# Patient Record
Sex: Male | Born: 1980 | Hispanic: Yes | Marital: Married | Smoking: Former smoker
Health system: Southern US, Community
[De-identification: ages and names within clinical notes are randomized; demographics above are authoritative.]

## PROBLEM LIST (undated history)

## (undated) DIAGNOSIS — N481 Balanitis: Secondary | ICD-10-CM

## (undated) DIAGNOSIS — N471 Phimosis: Secondary | ICD-10-CM

---

## 2009-11-14 HISTORY — PX: VASECTOMY: SHX75

## 2015-03-02 ENCOUNTER — Other Ambulatory Visit: Payer: Self-pay | Admitting: Urology

## 2015-03-04 ENCOUNTER — Encounter (HOSPITAL_BASED_OUTPATIENT_CLINIC_OR_DEPARTMENT_OTHER): Payer: Self-pay | Admitting: *Deleted

## 2015-03-06 ENCOUNTER — Ambulatory Visit (HOSPITAL_BASED_OUTPATIENT_CLINIC_OR_DEPARTMENT_OTHER): Admission: RE | Admit: 2015-03-06 | Payer: PRIVATE HEALTH INSURANCE | Source: Ambulatory Visit | Admitting: Urology

## 2015-03-06 ENCOUNTER — Encounter (HOSPITAL_BASED_OUTPATIENT_CLINIC_OR_DEPARTMENT_OTHER): Admission: RE | Payer: Self-pay | Source: Ambulatory Visit

## 2015-03-06 HISTORY — DX: Phimosis: N47.1

## 2015-03-06 HISTORY — DX: Balanitis: N48.1

## 2015-03-06 SURGERY — CIRCUMCISION, ADULT
Anesthesia: General

## 2017-06-25 ENCOUNTER — Emergency Department: Payer: PRIVATE HEALTH INSURANCE

## 2017-06-25 ENCOUNTER — Emergency Department
Admission: EM | Admit: 2017-06-25 | Discharge: 2017-06-25 | Disposition: A | Discharge status: Home / Self Care | Payer: PRIVATE HEALTH INSURANCE | Attending: Emergency Medicine | Admitting: Emergency Medicine

## 2017-06-25 ENCOUNTER — Encounter: Payer: Self-pay | Admitting: Emergency Medicine

## 2017-06-25 DIAGNOSIS — R079 Chest pain, unspecified: Secondary | ICD-10-CM | POA: Diagnosis present

## 2017-06-25 DIAGNOSIS — Z87891 Personal history of nicotine dependence: Secondary | ICD-10-CM | POA: Diagnosis not present

## 2017-06-25 DIAGNOSIS — R091 Pleurisy: Secondary | ICD-10-CM | POA: Diagnosis not present

## 2017-06-25 LAB — BASIC METABOLIC PANEL
ANION GAP: 7 (ref 5–15)
BUN: 11 mg/dL (ref 6–20)
CALCIUM: 9.3 mg/dL (ref 8.9–10.3)
CO2: 23 mmol/L (ref 22–32)
Chloride: 108 mmol/L (ref 101–111)
Creatinine, Ser: 0.87 mg/dL (ref 0.61–1.24)
GFR calc Af Amer: >60 mL/min (ref >60)
GLUCOSE: 92 mg/dL (ref 65–99)
Potassium: 4 mmol/L (ref 3.5–5.1)
SODIUM: 138 mmol/L (ref 135–145)

## 2017-06-25 LAB — CBC
HCT: 44.4 % (ref 40.0–52.0)
Hemoglobin: 15.4 g/dL (ref 13.0–18.0)
MCH: 29.5 pg (ref 26.0–34.0)
MCHC: 34.8 g/dL (ref 32.0–36.0)
MCV: 84.8 fL (ref 80.0–100.0)
PLATELETS: 252 10*3/uL (ref 150–440)
RBC: 5.23 MIL/uL (ref 4.40–5.90)
RDW: 12.7 % (ref 11.5–14.5)
WBC: 6.6 10*3/uL (ref 3.8–10.6)

## 2017-06-25 LAB — TROPONIN I

## 2017-06-25 MED ORDER — IBUPROFEN 800 MG PO TABS
800.0000 mg | ORAL_TABLET | ORAL | Status: AC
  Administered 2017-06-25: 800 mg via ORAL
  Filled 2017-06-25: qty 1

## 2017-06-25 MED ORDER — ASPIRIN 81 MG PO CHEW
324.0000 mg | CHEWABLE_TABLET | Freq: Once | ORAL | Status: AC
  Administered 2017-06-25: 324 mg via ORAL
  Filled 2017-06-25: qty 4

## 2017-06-25 NOTE — ED Triage Notes (Signed)
FIRST NURSE NOTE-pt c/o chest pain. Has [employer] in hand. Instructed not to eat until seen by MD. Ambulatory without difficulty

## 2017-06-25 NOTE — ED Triage Notes (Signed)
Pt to ED c/o Chest pain and shortness of breath. Pt states that the pain started 4 days ago. Pain is worse when he is laying down. Pt denies radiation of pain, N,V. Pt states that pain is constant and worse when taking a deep breath. Pt is able to talk in complete sentences, does not appear to be in any distress at this time.

## 2017-06-25 NOTE — Discharge Instructions (Signed)
You have been seen in the Emergency Department (ED) today for chest pain.    Return to the Emergency Department (ED) if you experience any concerning chest pain/pressure/tightness, difficulty breathing, or sudden sweating, or other symptoms that concern you.

## 2017-06-25 NOTE — ED Provider Notes (Signed)
[facility] Emergency Department Provider Note   ____________________________________________   First MD Initiated Contact with Patient 06/25/17 1203     (approximate)  I have reviewed the triage vital signs and the nursing notes.   HISTORY  Chief Complaint Chest Pain    HPI Scott Hart is a 36 y.o. male experiencing a feeling of discomfort in his mid chest for about 3 days now. Reports that he had a slight cough with a small amount of congestion that started about 4 days ago and has not gotten better, but is been experiencing and discomfort in his middle chest feels slightly sharp when he takes a deep breath. Seems slightly worse when he lays down. No nausea or vomiting. No pain or radiation of the arm neck back or jaw.  Denies feeling short of breath. Has no history of heart disease. Has lost weight recently with intention.  No history of surgeries recently. No trauma. No long trips or travel. No history of blood clots. Does not smoke or drink or use   Describes a mild, sharp and intermittent discomfort in the chest that seems to be worse whenever he takes a deep breath and goes away when he sits still.  Past Medical History:  Diagnosis Date  . Balanitis   . Phimosis     There are no active problems to display for this patient.   History reviewed. No pertinent surgical history.  Prior to Admission medications   Not on File    Allergies Patient has no known allergies.  No family history on file.  Social History Social History  Substance Use Topics  . Smoking status: Former Games developer  . Smokeless tobacco: Never Used  . Alcohol use No    Review of Systems Constitutional: No fever/chills Eyes: No visual changes. ENT: No sore throat. Cardiovascular: see history of present illness Respiratory: Denies shortness of breath.slight cough with a minimally productive component a few days ago that is now improving. Gastrointestinal: No  abdominal pain.  No nausea, no vomiting.  No diarrhea.  No constipation. Genitourinary: Negative for dysuria. Musculoskeletal: Negative for back pain. Skin: Negative for rash. Neurological: Negative for headaches, focal weakness or numbness.    ____________________________________________   PHYSICAL EXAM:  VITAL SIGNS: ED Triage Vitals  Enc Vitals Group     BP 06/25/17 1125 116/77     Pulse Rate 06/25/17 1125 70     Resp 06/25/17 1125 18     Temp 06/25/17 1125 99.2 F (37.3 C)     Temp Source 06/25/17 1125 Oral     SpO2 06/25/17 1125 100 %     Weight 06/25/17 1125 248 lb (112.5 kg)     Height 06/25/17 1125 6\' 1"  (1.854 m)     Head Circumference --      Peak Flow --      Pain Score 06/25/17 1128 3     Pain Loc --      Pain Edu? --      Excl. in GC? --     Constitutional: Alert and oriented. Well appearing and in no acute distress. Eyes: Conjunctivae are normal. Head: Atraumatic. Nose: No congestion/rhinnorhea. Mouth/Throat: Mucous membranes are moist. Neck: No stridor.   Cardiovascular: Normal rate, regular rhythm. Grossly normal heart sounds.  Good peripheral circulation. When laying flat, taking deep breath he reports pain is more prominent. No pain when breathing normally. Respiratory: Normal respiratory effort.  No retractions. Lungs CTAB. Gastrointestinal: Soft and nontender. No distention. Musculoskeletal: No  lower extremity tenderness nor edema. Neurologic:  Normal speech and language. No gross focal neurologic deficits are appreciated.  Skin:  Skin is warm, dry and intact. No rash noted. Psychiatric: Mood and affect are normal. Speech and behavior are normal.  ____________________________________________   LABS (all labs ordered are listed, but only abnormal results are displayed)  Labs Reviewed  BASIC METABOLIC PANEL  CBC  TROPONIN I   ____________________________________________  EKG  ED ECG REPORT I, Geneva Barrero, the attending physician,  personally viewed and interpreted this ECG.  Date: 06/25/2017 EKG Time: 1120 Rate: 70 Rhythm: normal sinus rhythm QRS Axis: normal Intervals: normal ST/T Wave abnormalities: normal Narrative Interpretation: unremarkable  ____________________________________________  RADIOLOGY  Dg Chest 2 View  Result Date: 06/25/2017 CLINICAL DATA:  Chest pain shortness of breath for the past 4 days. Ex-smoker. EXAM: CHEST  2 VIEW COMPARISON:  None. FINDINGS: Normal sized heart. Clear lungs. Mild peribronchial thickening. Minimal thoracic spine degenerative changes. IMPRESSION: Mild bronchitic changes. Electronically Signed   By: Beckie SaltsSteven  Reid M.D.   On: 06/25/2017 11:58    ____________________________________________   PROCEDURES  Procedure(s) performed: None  Procedures  Critical Care performed: No  ____________________________________________   INITIAL IMPRESSION / ASSESSMENT AND PLAN / ED COURSE  Pertinent labs & imaging results that were available during my care of the patient were reviewed by me and considered in my medical decision making (see chart for details).  Pleuritic symptoms.Follow a syndrome of what sounds similar to bronchitis or upper respiratory infection with a slight cough that is now improved. EKG and troponin normal. No risk factor for pulmonary embolism, dissection, pneumothorax. No associated abdominal pain. Heat score low risk.     Pulmonary Embolism Rule-out Criteria (PERC rule)                        If YES to ANY of the following, the PERC rule is not satisfied and cannot be used to rule out PE in this patient (consider d-dimer or imaging depending on pre-test probability).                      If NO to ALL of the following, AND the clinician's pre-test probability is <15%, the [facility] rule is satisfied and there is no need for further workup (including no need to obtain a d-dimer) as the post-test probability of pulmonary embolism is <2%.                       Mnemonic is HAD CLOTS   H - hormone use (exogenous estrogen)      No. A - age > 50                                                 No. D - DVT/PE history                                      No.   C - coughing blood (hemoptysis)                 No. L - leg swelling, unilateral  No. O - O2 Sat on Room Air < 95%                  No. T - tachycardia (HR ? 100)                         No. S - surgery or trauma, recent                      No.   Based on my evaluation of the patient, including application of this decision instrument, further testing to evaluate for pulmonary embolism is not indicated at this time. I have discussed this recommendation with the patient who states understanding and agreement with this plan.     ----------------------------------------- 12:37 PM on 06/25/2017 -----------------------------------------  Return precautions and treatment recommendations and follow-up discussed with the patient who is agreeable with the plan.   ____________________________________________   FINAL CLINICAL IMPRESSION(S) / ED DIAGNOSES  Final diagnoses:  Pleurisy  viral syndrome, now resolving    NEW MEDICATIONS STARTED DURING THIS VISIT:  There are no discharge medications for this patient.    Note:  This document was prepared using Dragon voice recognition software and may include unintentional dictation errors.     Sharyn Creamer, MD 06/25/17 [phone]

## 2017-12-26 ENCOUNTER — Encounter: Payer: Managed Care, Other (non HMO) | Admitting: Podiatry

## 2018-01-04 ENCOUNTER — Ambulatory Visit: Payer: Managed Care, Other (non HMO) | Admitting: Podiatry

## 2018-01-04 ENCOUNTER — Ambulatory Visit (INDEPENDENT_AMBULATORY_CARE_PROVIDER_SITE_OTHER): Payer: Managed Care, Other (non HMO)

## 2018-01-04 ENCOUNTER — Encounter: Payer: Self-pay | Admitting: Podiatry

## 2018-01-04 VITALS — BP 106/71 | HR 72 | Resp 16

## 2018-01-04 DIAGNOSIS — M7750 Other enthesopathy of unspecified foot: Secondary | ICD-10-CM | POA: Diagnosis not present

## 2018-01-04 DIAGNOSIS — S93402A Sprain of unspecified ligament of left ankle, initial encounter: Secondary | ICD-10-CM | POA: Diagnosis not present

## 2018-01-04 NOTE — Progress Notes (Signed)
  Subjective:  Patient ID: Scott Hart, male    DOB: 24-Nov-1980,  MRN: [id number] HPI Chief Complaint  Patient presents with  . Ankle Pain    Anterior and lateral ankle left - injury (fell) in July 2018, initial bruising and swelling, went to UC - gave brace to support, better, but still having discomfort depending on activity levels    37 y.o. male presents with the above complaint.     Past Medical History:  Diagnosis Date  . Balanitis   . Phimosis    No past surgical history on file. No current outpatient medications on file.  No Known Allergies Review of Systems  All other systems reviewed and are negative.  Objective:   Vitals:   01/04/18 1526  BP: 106/71  Pulse: 72  Resp: 16    General: Well developed, nourished, in no acute distress, alert and oriented x3   Dermatological: Skin is warm, dry and supple bilateral. Nails x 10 are well maintained; remaining integument appears unremarkable at this time. There are no open sores, no preulcerative lesions, no rash or signs of infection present.  Vascular: Dorsalis Pedis artery and Posterior Tibial artery pedal pulses are 2/4 bilateral with immedate capillary fill time. Pedal hair growth present. No varicosities and no lower extremity edema present bilateral.   Neruologic: Grossly intact via light touch bilateral. Vibratory intact via tuning fork bilateral. Protective threshold with Semmes Wienstein monofilament intact to all pedal sites bilateral. Patellar and Achilles deep tendon reflexes 2+ bilateral. No Babinski or clonus noted bilateral.   Musculoskeletal: No gross boney pedal deformities bilateral. No pain, crepitus, or limitation noted with foot and ankle range of motion bilateral. Muscular strength 5/5 in all groups tested bilateral.  Mild tenderness on palpation of the anterior talofibular ligament it feels thick in this area but there is no anterior drawer.  There is no crepitation on range of motion of the ankle  joint.  And does not appear to be unstable on inversion or eversion.  There is minimal reproducible pain.  Gait: Unassisted, Nonantalgic.    Radiographs:  Radiographs taken do not demonstrate any type of acute findings.  Soft tissue swelling to the anterior ankle only.  Assessment & Plan:   Assessment: Probable tear of the anterior talofibular ligament.  Ankle sprain.  Plan: Discussed etiology pathology conservative versus surgical therapies.  Recommended that he continue compression therapy and I dispensed ankle compression devices.  If the foot still feels unstable after 6 months from injury I recommend an MRI otherwise I will follow-up with him on an as-needed basis.     Max T. ScribnerHyatt, North DakotaDPM

## 2018-01-21 IMAGING — CR DG CHEST 2V
2 series · 2 of 2 positions shown · non-contrast
Comparison: None.

CLINICAL DATA: Chest pain shortness of breath for the past 4 days.
Ex-smoker.

EXAM:
CHEST  2 VIEW

[chest pa]
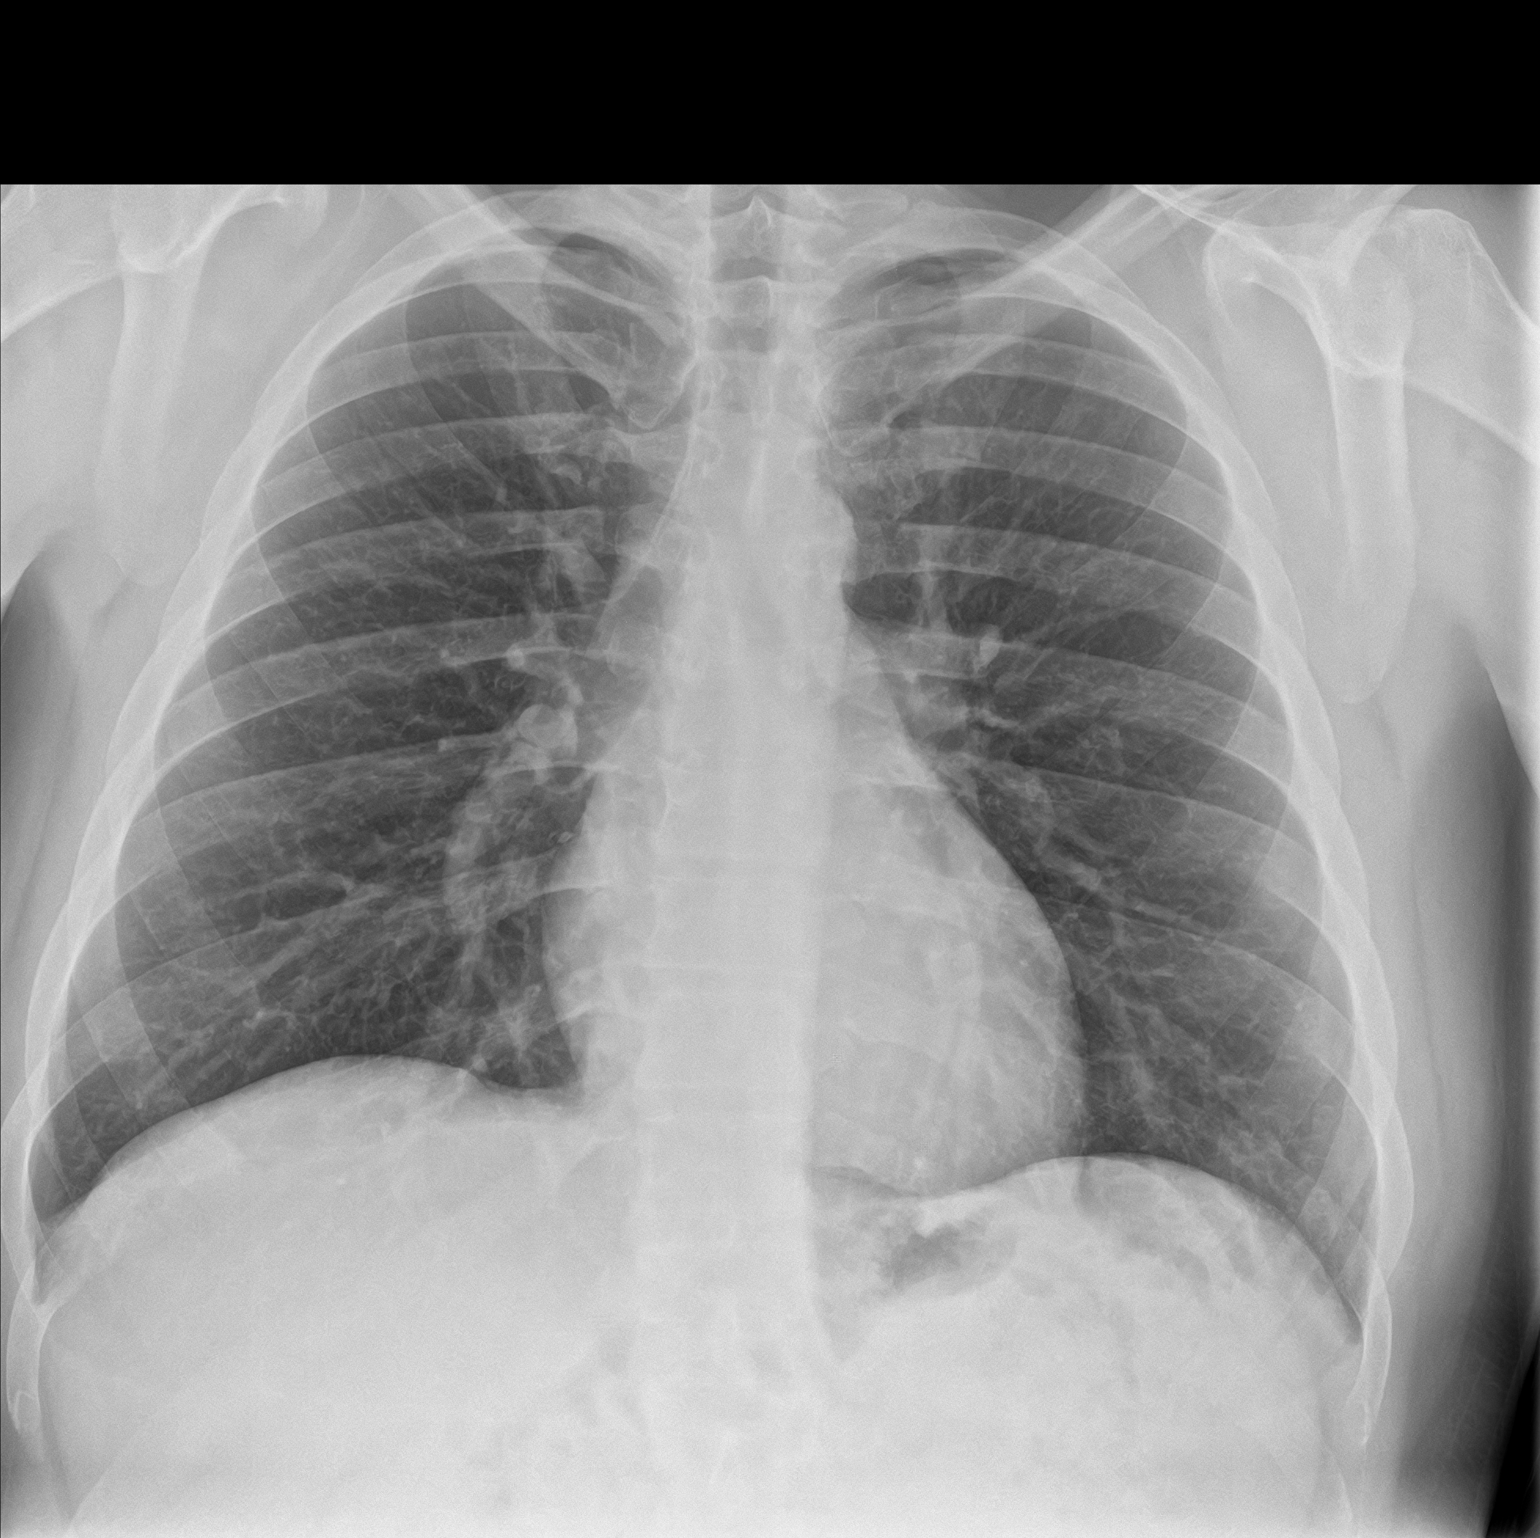

[chest lat]
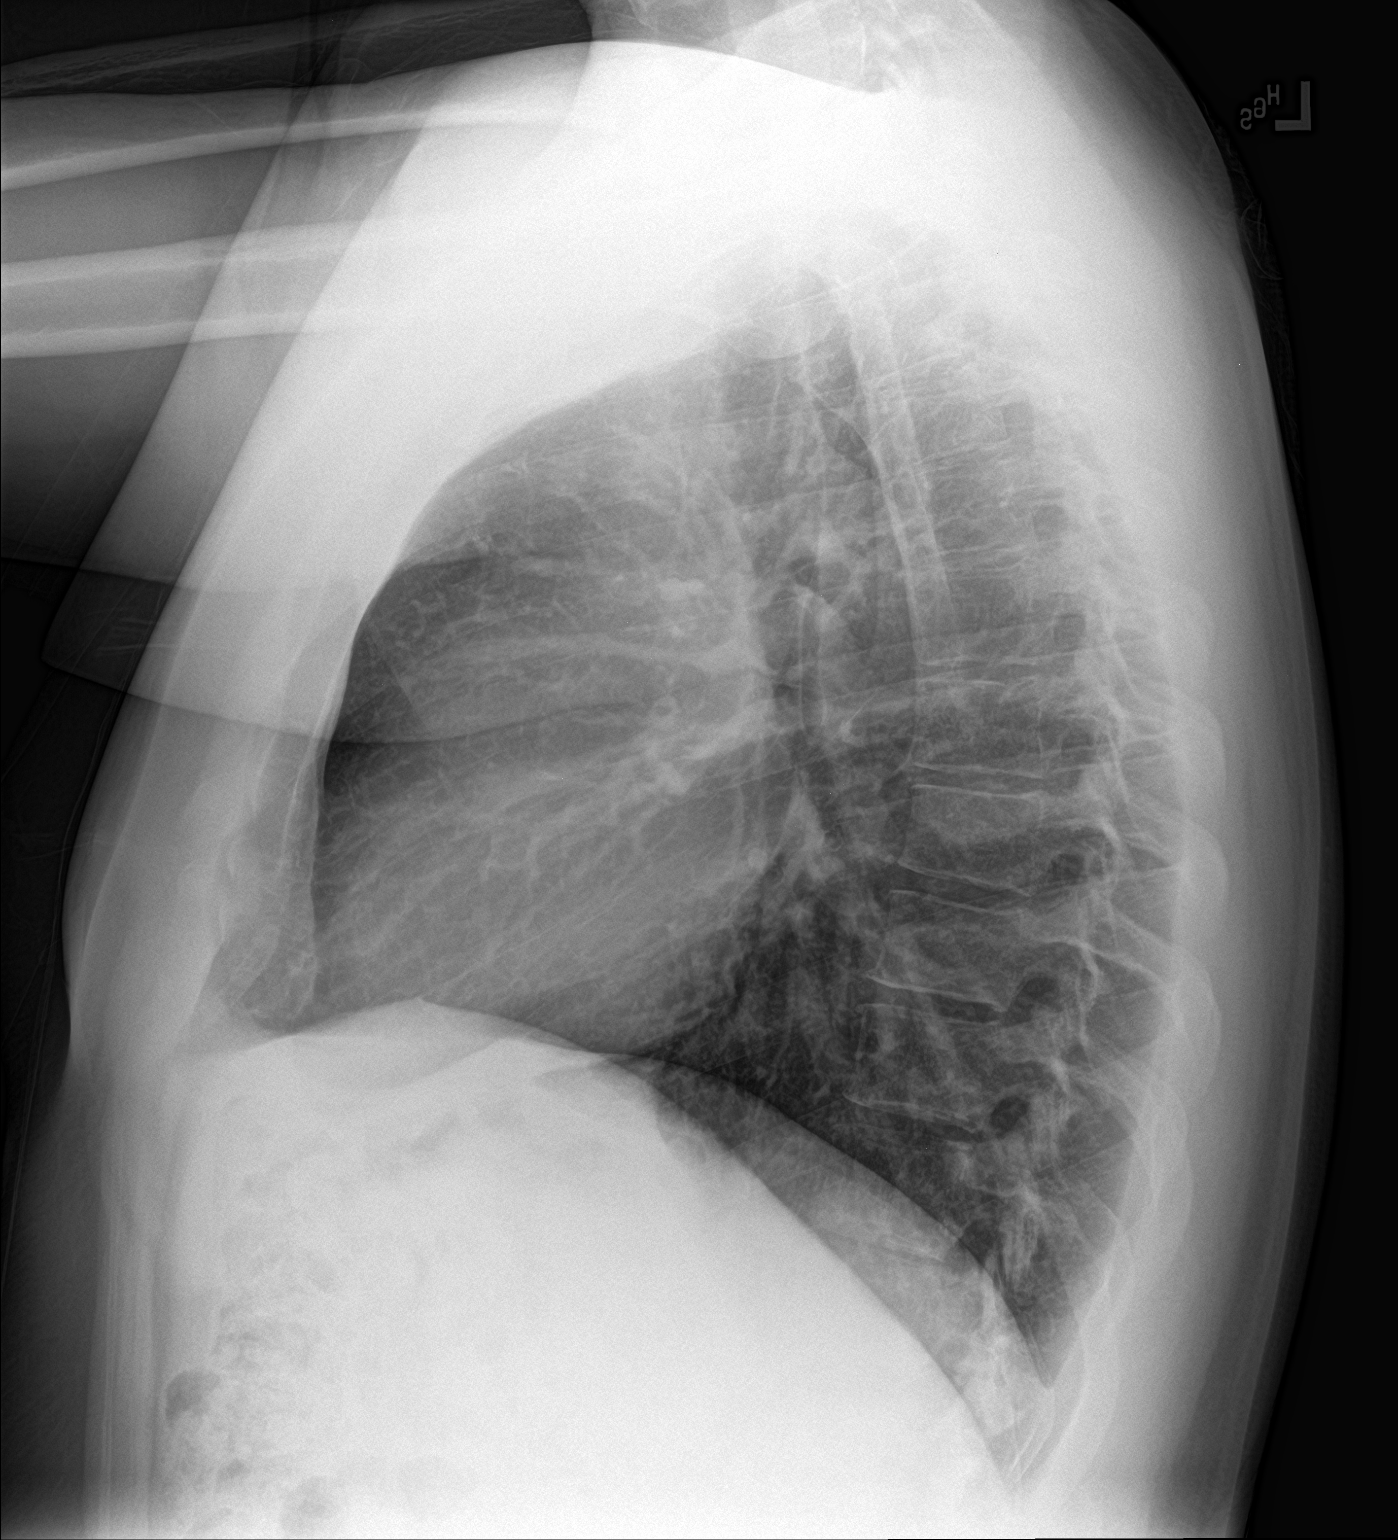

[2 of 2 positions shown; findings below may reference images not displayed]

FINDINGS: Normal sized heart. Clear lungs. Mild peribronchial thickening.
Minimal thoracic spine degenerative changes.
IMPRESSION: Mild bronchitic changes.

## 2018-01-22 NOTE — Progress Notes (Signed)
This encounter was created in error - please disregard.

## 2019-09-19 ENCOUNTER — Ambulatory Visit: Payer: Managed Care, Other (non HMO)

## 2019-09-19 ENCOUNTER — Encounter: Payer: Managed Care, Other (non HMO) | Admitting: Podiatry

## 2019-09-19 NOTE — Progress Notes (Signed)
This encounter was created in error - please disregard.

## 2020-02-29 ENCOUNTER — Encounter (HOSPITAL_COMMUNITY): Payer: Self-pay

## 2020-02-29 ENCOUNTER — Other Ambulatory Visit: Payer: Self-pay

## 2020-02-29 ENCOUNTER — Ambulatory Visit (HOSPITAL_COMMUNITY)
Admission: EM | Admit: 2020-02-29 | Discharge: 2020-02-29 | Disposition: A | Discharge status: Home / Self Care | Payer: Managed Care, Other (non HMO) | Attending: Emergency Medicine | Admitting: Emergency Medicine

## 2020-02-29 DIAGNOSIS — R05 Cough: Secondary | ICD-10-CM | POA: Insufficient documentation

## 2020-02-29 DIAGNOSIS — B349 Viral infection, unspecified: Secondary | ICD-10-CM | POA: Diagnosis not present

## 2020-02-29 DIAGNOSIS — Z87891 Personal history of nicotine dependence: Secondary | ICD-10-CM | POA: Diagnosis not present

## 2020-02-29 DIAGNOSIS — R439 Unspecified disturbances of smell and taste: Secondary | ICD-10-CM | POA: Insufficient documentation

## 2020-02-29 DIAGNOSIS — R42 Dizziness and giddiness: Secondary | ICD-10-CM | POA: Insufficient documentation

## 2020-02-29 DIAGNOSIS — U071 COVID-19: Secondary | ICD-10-CM

## 2020-02-29 DIAGNOSIS — R079 Chest pain, unspecified: Secondary | ICD-10-CM | POA: Diagnosis not present

## 2020-02-29 DIAGNOSIS — R0602 Shortness of breath: Secondary | ICD-10-CM | POA: Diagnosis not present

## 2020-02-29 HISTORY — DX: COVID-19: U07.1

## 2020-02-29 NOTE — ED Provider Notes (Signed)
[facility]    CSN: [id number] Arrival date & time: 02/29/20  1033      History   Chief Complaint Chief Complaint  Patient presents with  . Chest Pain  . Shortness of Breath    HPI Scott Hart is a 39 y.o. male.   Patient is a 39 year old male with no significant past medical history.  He presents today with approximate 3 days of intermittent, chest pain, mild shortness of breath and tightness in chest, nasal congestion, headache.  Worse with sitting upright and standing.  No specific shortness of breath on movement or exertion.  He has had abnormal sensation to left arm for the past 3 days.  Mild lightheadedness yesterday.  Recent travel to [place].  Has had some mild fever and chills.  Has been using over-the-counter cough drops for dry cough.  Has also had some loss of taste and smell.  No history of DVT or PE  ROS per HPI      Past Medical History:  Diagnosis Date  . Balanitis   . Phimosis     There are no problems to display for this patient.   History reviewed. No pertinent surgical history.     Home Medications    Prior to Admission medications   Not on File    Family History Family History  Problem Relation Age of Onset  . Healthy Mother   . Healthy Father     Social History Social History   Tobacco Use  . Smoking status: Former Games developer  . Smokeless tobacco: Never Used  Substance Use Topics  . Alcohol use: No  . Drug use: Never     Allergies   Patient has no known allergies.   Review of Systems Review of Systems   Physical Exam Triage Vital Signs ED Triage Vitals  Enc Vitals Group     BP 02/29/20 1128 135/90     Pulse Rate 02/29/20 1128 85     Resp 02/29/20 1128 16     Temp 02/29/20 1128 98.2 F (36.8 C)     Temp Source 02/29/20 1128 Oral     SpO2 02/29/20 1128 100 %     Weight --      Height --      Head Circumference --      Peak Flow --      Pain Score 02/29/20 1125 3     Pain Loc --      Pain Edu?  --      Excl. in GC? --    No data found.  Updated Vital Signs BP 135/90 (BP Location: Right Arm)   Pulse 85   Temp 98.2 F (36.8 C) (Oral)   Resp 16   SpO2 100%   Visual Acuity Right Eye Distance:   Left Eye Distance:   Bilateral Distance:    Right Eye Near:   Left Eye Near:    Bilateral Near:     Physical Exam Vitals and nursing note reviewed.  Constitutional:      General: He is not in acute distress.    Appearance: Normal appearance. He is not ill-appearing, toxic-appearing or diaphoretic.  HENT:     Head: Normocephalic and atraumatic.     Nose: Nose normal.  Eyes:     Conjunctiva/sclera: Conjunctivae normal.  Cardiovascular:     Rate and Rhythm: Normal rate and regular rhythm.     Pulses: Normal pulses.     Heart sounds: Normal heart sounds.  Pulmonary:  Effort: Pulmonary effort is normal.     Breath sounds: Normal breath sounds.  Musculoskeletal:        General: Normal range of motion.     Cervical back: Normal range of motion.     Comments: No color change or swelling to left arm or hand. 2+ radial pulse   Skin:    General: Skin is warm and dry.  Neurological:     Mental Status: He is alert.  Psychiatric:        Mood and Affect: Mood normal.      UC Treatments / Results  Labs (all labs ordered are listed, but only abnormal results are displayed) Labs Reviewed  SARS CORONAVIRUS 2 (TAT 6-24 HRS)    EKG   Radiology No results found.  Procedures Procedures (including critical care time)  Medications Ordered in UC Medications - No data to display  Initial Impression / Assessment and Plan / UC Course  I have reviewed the triage vital signs and the nursing notes.  Pertinent labs & imaging results that were available during my care of the patient were reviewed by me and considered in my medical decision making (see chart for details).     Viral illnes- highly suspicious for COVID based on symptoms. Covid swab sent for testing. No  concern for ACS at this time.  EKG with normal sinus rhythm and normal rate.  No concern for upper extremity DVT.  There is no swelling or irregularities to the left arm or hand.  2+ radial pulse. Recommended over-the-counter medications for symptoms and rest. Check MyChart for results Follow up as needed for continued or worsening symptoms   Final Clinical Impressions(s) / UC Diagnoses   Final diagnoses:  Viral illness     Discharge Instructions     There is high concern for Covid at this time. Recommend quarantine until we get the results. You can use over-the-counter medications as needed for your symptoms Your EKG was normal and I do not believe this is cardiac or heart related. Sign up for my chart and checked out for results    ED Prescriptions    None     PDMP not reviewed this encounter.   Orvan July, NP 02/29/20 1206

## 2020-02-29 NOTE — Discharge Instructions (Addendum)
There is high concern for Covid at this time. Recommend quarantine until we get the results. You can use over-the-counter medications as needed for your symptoms Your EKG was normal and I do not believe this is cardiac or heart related. Sign up for my chart and checked out for results

## 2020-02-29 NOTE — ED Triage Notes (Signed)
Pt c/o intermittent SOB, left sternal CP described as tightness for approx 3 days. Pt states SOB/CP occurs mainly while sitting upright and standing upright at rest; dissipates when lying and with movement/exertion. Also c/o "coldness" and "abnormal" feeling to left arm for 3 days.  Also c/o lightheadedness yesterday.  Also reports HA, nasal congestion onset today.  Denies any diaphoresis, n/v, changes with vision or speaking/walking or radiating pain.

## 2020-03-01 ENCOUNTER — Telehealth: Payer: Self-pay

## 2020-03-01 LAB — SARS CORONAVIRUS 2 (TAT 6-24 HRS): SARS Coronavirus 2: POSITIVE — AB

## 2020-03-31 ENCOUNTER — Other Ambulatory Visit: Payer: Self-pay | Admitting: Urology

## 2020-03-31 ENCOUNTER — Encounter (HOSPITAL_BASED_OUTPATIENT_CLINIC_OR_DEPARTMENT_OTHER): Payer: Self-pay | Admitting: Urology

## 2020-03-31 ENCOUNTER — Other Ambulatory Visit: Payer: Self-pay

## 2020-03-31 NOTE — Progress Notes (Signed)
Spoke w/ via phone for pre-op interview---patient Lab needs dos----none              COVID test ------+ covid 02-29-2020 no retest needed Arrive at -------1000 am 04-01-2020 NPO after ------midnight Medications to take morning of surgery -----none Diabetic medication -----n/a Patient Special Instructions -----none Pre-Op special Istructions -----none Patient verbalized understanding of instructions that were given at this phone interview. Patient denies shortness of breath, chest pain, fever, cough a this phone interview.

## 2020-03-31 NOTE — Progress Notes (Signed)
Left pre op instructions for patient on voicemail, npo after midnight, arrive 1000 am [address], may brush teeth spit water out, may shower, nothing on skin after shower, no jewelry, watches, rings or metal on body, bring all current medications in original containers. Have driver and caregiver arranged for after surgery tomorrow.bring photo id and health insurance card. Call [phone] if cannot arrive by 1000 am 04-01-2020.

## 2020-03-31 NOTE — Anesthesia Preprocedure Evaluation (Addendum)
Anesthesia Evaluation  Patient identified by MRN, date of birth, ID band Patient awake    Reviewed: Allergy & Precautions, NPO status , Patient's Chart, lab work & pertinent test results  Airway Mallampati: II  TM Distance: >3 FB Neck ROM: Full    Dental no notable dental hx. (+) Teeth Intact, Dental Advisory Given   Pulmonary neg pulmonary ROS, former smoker,  Pos covid-19 02/29/20   Pulmonary exam normal breath sounds clear to auscultation       Cardiovascular Exercise Tolerance: Good negative cardio ROS Normal cardiovascular exam Rhythm:Regular Rate:Normal     Neuro/Psych negative neurological ROS  negative psych ROS   GI/Hepatic negative GI ROS, Neg liver ROS,   Endo/Other  negative endocrine ROS  Renal/GU negative Renal ROS     Musculoskeletal negative musculoskeletal ROS (+)   Abdominal (+) + obese,   Peds  Hematology negative hematology ROS (+)   Anesthesia Other Findings   Reproductive/Obstetrics                            Anesthesia Physical Anesthesia Plan  ASA: I  Anesthesia Plan: General   Post-op Pain Management:    Induction: Intravenous  PONV Risk Score and Plan: 3 and Treatment may vary due to age or medical condition, Ondansetron, Dexamethasone and Midazolam  Airway Management Planned: LMA  Additional Equipment: None  Intra-op Plan:   Post-operative Plan:   Informed Consent: I have reviewed the patients History and Physical, chart, labs and discussed the procedure including the risks, benefits and alternatives for the proposed anesthesia with the patient or authorized representative who has indicated his/her understanding and acceptance.     Dental advisory given  Plan Discussed with: CRNA  Anesthesia Plan Comments:        Anesthesia Quick Evaluation

## 2020-04-01 ENCOUNTER — Ambulatory Visit (HOSPITAL_BASED_OUTPATIENT_CLINIC_OR_DEPARTMENT_OTHER)
Admission: RE | Admit: 2020-04-01 | Discharge: 2020-04-01 | Disposition: A | Discharge status: Home / Self Care | Payer: Managed Care, Other (non HMO) | Attending: Urology | Admitting: Urology

## 2020-04-01 ENCOUNTER — Ambulatory Visit (HOSPITAL_BASED_OUTPATIENT_CLINIC_OR_DEPARTMENT_OTHER): Payer: Managed Care, Other (non HMO) | Admitting: Anesthesiology

## 2020-04-01 ENCOUNTER — Encounter (HOSPITAL_BASED_OUTPATIENT_CLINIC_OR_DEPARTMENT_OTHER): Payer: Self-pay | Admitting: Urology

## 2020-04-01 ENCOUNTER — Other Ambulatory Visit: Payer: Self-pay

## 2020-04-01 ENCOUNTER — Encounter (HOSPITAL_BASED_OUTPATIENT_CLINIC_OR_DEPARTMENT_OTHER)
Admission: RE | Disposition: A | Discharge status: Home / Self Care | Payer: Self-pay | Source: Home / Self Care | Attending: Urology

## 2020-04-01 DIAGNOSIS — Z8616 Personal history of COVID-19: Secondary | ICD-10-CM | POA: Diagnosis not present

## 2020-04-01 DIAGNOSIS — Z6838 Body mass index (BMI) 38.0-38.9, adult: Secondary | ICD-10-CM | POA: Insufficient documentation

## 2020-04-01 DIAGNOSIS — Z87891 Personal history of nicotine dependence: Secondary | ICD-10-CM | POA: Diagnosis not present

## 2020-04-01 DIAGNOSIS — E669 Obesity, unspecified: Secondary | ICD-10-CM | POA: Diagnosis not present

## 2020-04-01 DIAGNOSIS — N471 Phimosis: Secondary | ICD-10-CM | POA: Insufficient documentation

## 2020-04-01 HISTORY — PX: CIRCUMCISION: SHX1350

## 2020-04-01 SURGERY — CIRCUMCISION, ADULT
Anesthesia: General | Site: Penis

## 2020-04-01 MED ORDER — PROPOFOL 10 MG/ML IV BOLUS
INTRAVENOUS | Status: AC
  Filled 2020-04-01: qty 20

## 2020-04-01 MED ORDER — BUPIVACAINE HCL 0.25 % IJ SOLN
INTRAMUSCULAR | Status: DC | PRN
  Administered 2020-04-01: 10 mL

## 2020-04-01 MED ORDER — ONDANSETRON HCL 4 MG/2ML IJ SOLN
INTRAMUSCULAR | Status: DC | PRN
  Administered 2020-04-01: 4 mg via INTRAVENOUS

## 2020-04-01 MED ORDER — LIDOCAINE 2% (20 MG/ML) 5 ML SYRINGE
INTRAMUSCULAR | Status: DC | PRN
  Administered 2020-04-01: 100 mg via INTRAVENOUS

## 2020-04-01 MED ORDER — ONDANSETRON HCL 4 MG/2ML IJ SOLN
INTRAMUSCULAR | Status: AC
  Filled 2020-04-01: qty 2

## 2020-04-01 MED ORDER — FENTANYL CITRATE (PF) 100 MCG/2ML IJ SOLN
INTRAMUSCULAR | Status: DC | PRN
  Administered 2020-04-01 (×2): 50 ug via INTRAVENOUS

## 2020-04-01 MED ORDER — PROPOFOL 10 MG/ML IV BOLUS
INTRAVENOUS | Status: DC | PRN
  Administered 2020-04-01: 300 mg via INTRAVENOUS

## 2020-04-01 MED ORDER — CLINDAMYCIN PHOSPHATE 600 MG/50ML IV SOLN
600.0000 mg | INTRAVENOUS | Status: AC
  Administered 2020-04-01: 600 mg via INTRAVENOUS

## 2020-04-01 MED ORDER — MIDAZOLAM HCL 2 MG/2ML IJ SOLN
INTRAMUSCULAR | Status: DC | PRN
  Administered 2020-04-01: 2 mg via INTRAVENOUS

## 2020-04-01 MED ORDER — LACTATED RINGERS IV SOLN: INTRAVENOUS | Status: DC

## 2020-04-01 MED ORDER — LIDOCAINE HCL (PF) 1 % IJ SOLN
INTRAMUSCULAR | Status: DC | PRN
  Administered 2020-04-01: 10 mL

## 2020-04-01 MED ORDER — DEXAMETHASONE SODIUM PHOSPHATE 10 MG/ML IJ SOLN
INTRAMUSCULAR | Status: AC
  Filled 2020-04-01: qty 1

## 2020-04-01 MED ORDER — SENNOSIDES-DOCUSATE SODIUM 8.6-50 MG PO TABS
1.0000 | ORAL_TABLET | Freq: Two times a day (BID) | ORAL | 0 refills | Status: AC
Start: 2020-04-01 — End: ?

## 2020-04-01 MED ORDER — CLINDAMYCIN PHOSPHATE 600 MG/50ML IV SOLN
INTRAVENOUS | Status: AC
  Filled 2020-04-01: qty 50

## 2020-04-01 MED ORDER — ONDANSETRON HCL 4 MG/2ML IJ SOLN: 4.0000 mg | Freq: Once | INTRAMUSCULAR | Status: DC | PRN

## 2020-04-01 MED ORDER — FENTANYL CITRATE (PF) 100 MCG/2ML IJ SOLN
INTRAMUSCULAR | Status: AC
  Filled 2020-04-01: qty 2

## 2020-04-01 MED ORDER — HYDROMORPHONE HCL 1 MG/ML IJ SOLN: 0.2500 mg | INTRAMUSCULAR | Status: DC | PRN

## 2020-04-01 MED ORDER — OXYCODONE HCL 5 MG/5ML PO SOLN: 5.0000 mg | Freq: Once | ORAL | Status: DC | PRN

## 2020-04-01 MED ORDER — LIDOCAINE 2% (20 MG/ML) 5 ML SYRINGE
INTRAMUSCULAR | Status: AC
  Filled 2020-04-01: qty 5

## 2020-04-01 MED ORDER — MIDAZOLAM HCL 2 MG/2ML IJ SOLN
INTRAMUSCULAR | Status: AC
  Filled 2020-04-01: qty 2

## 2020-04-01 MED ORDER — DEXAMETHASONE SODIUM PHOSPHATE 10 MG/ML IJ SOLN
INTRAMUSCULAR | Status: DC | PRN
  Administered 2020-04-01: 10 mg via INTRAVENOUS

## 2020-04-01 MED ORDER — OXYCODONE HCL 5 MG PO TABS: 5.0000 mg | ORAL_TABLET | Freq: Once | ORAL | Status: DC | PRN

## 2020-04-01 MED ORDER — KETOROLAC TROMETHAMINE 30 MG/ML IJ SOLN: 30.0000 mg | Freq: Once | INTRAMUSCULAR | Status: DC | PRN

## 2020-04-01 MED ORDER — TRAMADOL HCL 50 MG PO TABS: 50.0000 mg | ORAL_TABLET | Freq: Four times a day (QID) | ORAL | 0 refills | Status: AC | PRN

## 2020-04-01 SURGICAL SUPPLY — 30 items
BLADE HEX COATED 2.75 (ELECTRODE) ×2 IMPLANT
BLADE SURG 15 STRL LF DISP TIS (BLADE) ×1 IMPLANT
BLADE SURG 15 STRL SS (BLADE) ×2
BNDG COHESIVE 1X5 TAN STRL LF (GAUZE/BANDAGES/DRESSINGS) ×2 IMPLANT
BNDG COHESIVE 2X5 TAN STRL LF (GAUZE/BANDAGES/DRESSINGS) ×3 IMPLANT
BNDG CONFORM 2 STRL LF (GAUZE/BANDAGES/DRESSINGS) ×3 IMPLANT
COVER BACK TABLE 60X90IN (DRAPES) ×3 IMPLANT
COVER MAYO STAND STRL (DRAPES) ×3 IMPLANT
COVER WAND RF STERILE (DRAPES) ×3 IMPLANT
DRAPE LAPAROTOMY 100X72 PEDS (DRAPES) ×3 IMPLANT
ELECT NDL TIP 2.8 STRL (NEEDLE) IMPLANT
ELECT NEEDLE TIP 2.8 STRL (NEEDLE) IMPLANT
ELECT REM PT RETURN 9FT ADLT (ELECTROSURGICAL) ×3
ELECTRODE REM PT RTRN 9FT ADLT (ELECTROSURGICAL) ×1 IMPLANT
GAUZE XEROFORM 1X8 LF (GAUZE/BANDAGES/DRESSINGS) ×3 IMPLANT
GLOVE BIO SURGEON STRL SZ7.5 (GLOVE) ×3 IMPLANT
GOWN STRL REUS W/TWL LRG LVL3 (GOWN DISPOSABLE) ×3 IMPLANT
KIT TURNOVER CYSTO (KITS) ×3 IMPLANT
NDL HYPO 25X1 1.5 SAFETY (NEEDLE) ×1 IMPLANT
NEEDLE HYPO 25X1 1.5 SAFETY (NEEDLE) ×3 IMPLANT
NS IRRIG 500ML POUR BTL (IV SOLUTION) IMPLANT
PENCIL BUTTON HOLSTER BLD 10FT (ELECTRODE) ×3 IMPLANT
SET BASIN DAY SURGERY F.S. (CUSTOM PROCEDURE TRAY) ×3 IMPLANT
SUT VIC AB 3-0 SH 27 (SUTURE) ×4
SUT VIC AB 3-0 SH 27X BRD (SUTURE) IMPLANT
SUT VIC AB 3-0 SH 27XBRD (SUTURE) IMPLANT
SYR CONTROL 10ML LL (SYRINGE) ×3 IMPLANT
TOWEL OR 17X26 10 PK STRL BLUE (TOWEL DISPOSABLE) ×3 IMPLANT
TRAY DSU PREP LF (CUSTOM PROCEDURE TRAY) ×3 IMPLANT
WATER STERILE IRR 500ML POUR (IV SOLUTION) IMPLANT

## 2020-04-01 NOTE — Op Note (Signed)
NAMEWALTON, DIGILIO MEDICAL RECORD HT:[id number] ACCOUNT [id number] DATE OF BIRTH:June 08, 1981 FACILITY: [facility] LOCATION: [facility] PHYSICIAN:THEODORE Tresa Moore, MD  OPERATIVE REPORT  DATE OF PROCEDURE:  04/01/2020  SURGEON:  Alexis Frock, MD  PREOPERATIVE DIAGNOSIS:  Phimosis.  PROCEDURE: 1.  Circumcision. 2.  Penile block.  ESTIMATED BLOOD LOSS:  Nil.  COMPLICATIONS:  None.  SPECIMEN:  Foreskin for discard.  FINDINGS:   1.  Moderate phimosis, post-circumcision. 2.  Complete resolution of phimotic ring, post-circumcision.  INDICATIONS:  The patient is a pleasant 38 year old man who was uncircumcised.  He has had multiple bouts of balanitis with some scarification and phimotic ring, making penile hygiene and intercourse quite painful and difficult.  Options discussed for  management including topical therapy, which he has tried, but not met his goals versus more definitive therapy with a dorsal slit versus circumcision and he wished to have circumcision with penile block.  Informed consent was obtained and placed in the  medical record.  DESCRIPTION OF PROCEDURE:  The patient being identified, the procedure being circumcision and penile block was confirmed.  Procedure timeout was performed.  Intravenous access administered.  General anesthesia induced.  The patient was placed into a  supine position.  Sterile field was created, prepping and draping the  base of the penis, perineum and proximal thighs using iodine.  His inner and outer foreskin leaflets were easily prepped at site of the phimotic ring.  Next, the 12 o'clock position,  the penile skin was marked and the proximal collar corresponding to the unstretched corona of the glans was marked.  The distal collar was developed approximately 5 mm proximal to the corona of the glans and then circumferentially incising the proximal  collar, connecting the 2 and 12 o'clock position and releasing the redundant prepuce using cautery  dissection.  Additional point coagulation current was performed, which revealed excellent hemostasis.  The 12 o'clock stay suture was applied, as was a 6  o'clock U stitch at the area of the frenulum and 2 separate running suture lines of 3-0 Vicryl were used from 12 o'clock to 6 o'clock position on each side respectively, which revealed an excellent skin apposition and hemostasis.  Attention was directed  to penile block.  Ten mL of 50:50 Marcaine, lidocaine slurry was instilled along the projected course of the dorsal penile nerve beneath the pubic bone with additional 10 mL in a ring block fashion at the base of the penis.  A dressing of Xeroform,  followed by Doreene Nest and Coban was applied.  The procedure was terminated.  The patient tolerated the procedure well.  No immediate perioperative complications.  The patient was taken to postanesthesia care unit in stable condition.  Plan to discharge home.  VN/NUANCE  D:04/01/2020 T:04/01/2020 JOB:[id number]

## 2020-04-01 NOTE — H&P (Signed)
Scott Hart is an 39 y.o. male.    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 Has managed with topicals prior with good result but overall worsening. Non-diabetic. Now really interfearing with intercourse.   PMH sig for mild obesity. NO CV disease /blood thinners.   Today " Scott Hart " is seen to proceed with elective circumcision. H/o uncomplicated C19 few mos ago and recovered. No interval fevers, URI sympotms.   Past Medical History:  Diagnosis Date  . COVID-19 02/29/2020   fatigue x 4 days none since  . Phimosis     Past Surgical History:  Procedure Laterality Date  . VASECTOMY  2011    Family History  Problem Relation Age of Onset  . Healthy Mother   . Healthy Father    Social History:  reports that he has quit smoking. His smoking use included cigarettes. He quit after 2.00 years of use. He has never used smokeless tobacco. He reports that he does not drink alcohol or use drugs.  Allergies: No Known Allergies  No medications prior to admission.    No results found for this or any previous visit (from the past 48 hour(s)). No results found.  Review of Systems  Constitutional: Negative for chills and fever.  All other systems reviewed and are negative.   Height 6\' 2"  (1.88 m), weight 117.9 kg. Physical Exam  Constitutional: He appears well-developed.  HENT:  Head: Normocephalic.  Eyes: Pupils are equal, round, and reactive to light.  Cardiovascular: Normal rate.  Respiratory: Effort normal.  GI: Soft.  Genitourinary:    Genitourinary Comments: Stable phimosis w/o active balanitis.    Musculoskeletal:        General: Normal range of motion.     Cervical back: Normal range of motion.  Neurological: He is alert.  Skin: Skin is warm.  Psychiatric: He has a normal mood and affect.     Assessment/Plan  Proceed as planned with elective circumcision.  Risks, benefits, expected peri-op course discussed previously and reiterated today.   , MD 04/01/2020, 8:46 AM

## 2020-04-01 NOTE — Anesthesia Procedure Notes (Signed)
Procedure Name: LMA Insertion Date/Time: 04/01/2020 12:33 PM Performed by: Norva Pavlov, CRNA Pre-anesthesia Checklist: Patient identified, Emergency Drugs available, Suction available and Patient being monitored Patient Re-evaluated:Patient Re-evaluated prior to induction Oxygen Delivery Method: Circle system utilized Preoxygenation: Pre-oxygenation with 100% oxygen Induction Type: IV induction Ventilation: Mask ventilation without difficulty LMA: LMA inserted LMA Size: 5.0 Number of attempts: 1 Airway Equipment and Method: Bite block Placement Confirmation: positive ETCO2 Tube secured with: Tape Dental Injury: Teeth and Oropharynx as per pre-operative assessment

## 2020-04-01 NOTE — Discharge Instructions (Signed)
1 - All stitches are dissolvable and will go away in about 3 weeks. Dressing can come off tomorrow morning and can resume all showering / bathing. No sexual stimulation x 2 weeks.   2 - Call MD or go to ER for fever >102, severe pain / nausea / vomiting not relieved by medications, or acute change in medical status   Circumcision-Home Care Instructions  The following instructions have been prepared to help you care for yourself upon your return home today.   Wound Care & Hygiene:   You may apply ice to the penis.  This may help to decrease swelling.  Remove the dressing tomorrow.  If the dressing falls off before then, leave it off.  You may shower or bathe in 48 hours  Gently wash the penis with soap and water.  The stitches do not need to be removed.  Activity:  Do not drive or operate any equipment today.  The effects of anesthesia are still present, drowsiness may result.  Rest today, not necessarily flat bed rest, just take it easy.  You may resume your normal activity in one to two days or as indicated by your physician.  Sexual Activity:  Erection and sexual relations should be avoided for *2 weeks.  Return to Work:  One to two days or as indicated by your physician.  Diet:  Drink liquids or eat a very light diet this evening.  You may resume a regular diet tomorrow.  General Expectations of your surgery:   You may have a small amount of bleeding  The penis will be swollen and bruised for approximately one week  You may wake during the night with an erection, usually this is caused by having a full bladder so you should try to urinate (pass your water) to relieve the erection or apply ice to the penis  Unexpected Observations - Call your doctor if these occur!  Persistent or heavy bleeding  Temperature of 101 degrees or more  Severe pain not relieved by medication   Post Anesthesia Home Care Instructions  Activity: Get plenty of rest for the remainder of the day.  A responsible individual must stay with you for 24 hours following the procedure.  For the next 24 hours, DO NOT: -Drive a car -Advertising copywriter -Drink alcoholic beverages -Take any medication unless instructed by your physician -Make any legal decisions or sign important papers.  Meals: Start with liquid foods such as gelatin or soup. Progress to regular foods as tolerated. Avoid greasy, spicy, heavy foods. If nausea and/or vomiting occur, drink only clear liquids until the nausea and/or vomiting subsides. Call your physician if vomiting continues.  Special Instructions/Symptoms: Your throat may feel dry or sore from the anesthesia or the breathing tube placed in your throat during surgery. If this causes discomfort, gargle with warm salt water. The discomfort should disappear within 24 hours.

## 2020-04-01 NOTE — Brief Op Note (Signed)
04/01/2020  1:10 PM  PATIENT:  Scott Hart  39 y.o. male  PRE-OPERATIVE DIAGNOSIS:  PHIMOSIS  POST-OPERATIVE DIAGNOSIS:  PHIMOSIS  PROCEDURE:  Procedure(s) with comments: CIRCUMCISION ADULT (N/A) - PENILE BLOCK  SURGEON:  Surgeon(s) and Role:    * Sebastian Ache, MD - Primary  PHYSICIAN ASSISTANT:   ASSISTANTS: none   ANESTHESIA:   local and general  EBL:  10 mL   BLOOD ADMINISTERED:none  DRAINS: none   LOCAL MEDICATIONS USED:  MARCAINE    and XYLOCAINE   SPECIMEN:  Source of Specimen:  reduncant foreskin  DISPOSITION OF SPECIMEN:  discard  COUNTS:  YES  TOURNIQUET:  * No tourniquets in log *  DICTATION: .Other Dictation: Dictation Number [id number]  PLAN OF CARE: Discharge to home after PACU  PATIENT DISPOSITION:  PACU - hemodynamically stable.   Delay start of Pharmacological VTE agent (>24hrs) due to surgical blood loss or risk of bleeding: not applicable

## 2020-04-01 NOTE — Transfer of Care (Signed)
Immediate Anesthesia Transfer of Care Note  Patient: Scott Hart  Procedure(s) Performed: CIRCUMCISION ADULT (N/A Penis)  Patient Location: PACU  Anesthesia Type:General  Level of Consciousness: awake, alert  and oriented  Airway & Oxygen Therapy: Patient Spontanous Breathing and Patient connected to nasal cannula oxygen  Post-op Assessment: Report given to RN  Post vital signs: Reviewed and stable  Last Vitals:  Vitals Value Taken Time  BP 128/84 04/01/20 1315  Temp    Pulse 89 04/01/20 1317  Resp 17 04/01/20 1317  SpO2 99 % 04/01/20 1317  Vitals shown include unvalidated device data.  Last Pain:  Vitals:   04/01/20 1013  TempSrc: Oral         Complications: No apparent anesthesia complications

## 2020-04-02 NOTE — Anesthesia Postprocedure Evaluation (Signed)
Anesthesia Post Note  Patient: Scott Hart  Procedure(s) Performed: CIRCUMCISION ADULT (N/A Penis)     Patient location during evaluation: PACU Anesthesia Type: General Level of consciousness: awake and alert Pain management: pain level controlled Vital Signs Assessment: post-procedure vital signs reviewed and stable Respiratory status: spontaneous breathing, nonlabored ventilation, respiratory function stable and patient connected to nasal cannula oxygen Cardiovascular status: blood pressure returned to baseline and stable Postop Assessment: no apparent nausea or vomiting Anesthetic complications: no    Last Vitals:  Vitals:   04/01/20 1345 04/01/20 1409  BP: 124/77 (!) 127/92  Pulse: 71 65  Resp: 14 14  Temp:  36.9 C  SpO2: 97% 100%    Last Pain:  Vitals:   04/01/20 1409  TempSrc:   PainSc: 0-No pain                 Kahli Fitzgerald P Hondo Nanda

## 2021-09-14 ENCOUNTER — Encounter: Payer: Self-pay | Admitting: Emergency Medicine

## 2021-09-14 ENCOUNTER — Other Ambulatory Visit: Payer: Self-pay

## 2021-09-14 ENCOUNTER — Emergency Department
Admission: EM | Admit: 2021-09-14 | Discharge: 2021-09-14 | Disposition: A | Discharge status: Home / Self Care | Payer: PRIVATE HEALTH INSURANCE | Attending: Emergency Medicine | Admitting: Emergency Medicine

## 2021-09-14 DIAGNOSIS — R110 Nausea: Secondary | ICD-10-CM | POA: Diagnosis present

## 2021-09-14 DIAGNOSIS — Z8616 Personal history of COVID-19: Secondary | ICD-10-CM | POA: Insufficient documentation

## 2021-09-14 DIAGNOSIS — F410 Panic disorder [episodic paroxysmal anxiety] without agoraphobia: Secondary | ICD-10-CM | POA: Insufficient documentation

## 2021-09-14 LAB — BASIC METABOLIC PANEL
Anion gap: 7 (ref 5–15)
BUN: 8 mg/dL (ref 6–20)
CO2: 26 mmol/L (ref 22–32)
Calcium: 9.3 mg/dL (ref 8.9–10.3)
Chloride: 106 mmol/L (ref 98–111)
Creatinine, Ser: 0.9 mg/dL (ref 0.61–1.24)
GFR, Estimated: >60 mL/min (ref >60)
Glucose, Bld: 113 mg/dL — ABNORMAL HIGH (ref 70–99)
Potassium: 3.8 mmol/L (ref 3.5–5.1)
Sodium: 139 mmol/L (ref 135–145)

## 2021-09-14 LAB — HEPATIC FUNCTION PANEL
ALT: 19 U/L (ref 0–44)
AST: 22 U/L (ref 15–41)
Albumin: 4 g/dL (ref 3.5–5.0)
Alkaline Phosphatase: 47 U/L (ref 38–126)
Bilirubin, Direct: 0.1 mg/dL (ref 0.0–0.2)
Indirect Bilirubin: 1.4 mg/dL — ABNORMAL HIGH (ref 0.3–0.9)
Total Bilirubin: 1.5 mg/dL — ABNORMAL HIGH (ref 0.3–1.2)
Total Protein: 8 g/dL (ref 6.5–8.1)

## 2021-09-14 LAB — CBC
HCT: 45 % (ref 39.0–52.0)
Hemoglobin: 15.8 g/dL (ref 13.0–17.0)
MCH: 30.6 pg (ref 26.0–34.0)
MCHC: 35.1 g/dL (ref 30.0–36.0)
MCV: 87 fL (ref 80.0–100.0)
Platelets: 281 10*3/uL (ref 150–400)
RBC: 5.17 MIL/uL (ref 4.22–5.81)
RDW: 12.5 % (ref 11.5–15.5)
WBC: 10.4 10*3/uL (ref 4.0–10.5)
nRBC: 0 % (ref 0.0–0.2)

## 2021-09-14 LAB — URINALYSIS, ROUTINE W REFLEX MICROSCOPIC
Bilirubin Urine: NEGATIVE
Glucose, UA: NEGATIVE mg/dL
Hgb urine dipstick: NEGATIVE
Ketones, ur: NEGATIVE mg/dL
Leukocytes,Ua: NEGATIVE
Nitrite: NEGATIVE
Protein, ur: NEGATIVE mg/dL
Specific Gravity, Urine: 1.01 (ref 1.005–1.030)
pH: 6 (ref 5.0–8.0)

## 2021-09-14 LAB — TROPONIN I (HIGH SENSITIVITY): Troponin I (High Sensitivity): 2 ng/L (ref <18)

## 2021-09-14 LAB — CK: Total CK: 111 U/L (ref 49–397)

## 2021-09-14 NOTE — ED Triage Notes (Signed)
Pt via POV from home. Pt states he was at work today and felt disoriented, lightheaded and dizziness pt states he has been more weak than normal. Denies NVD. Denies pain.  Pt is A&Ox4 and NAD.

## 2021-09-14 NOTE — Discharge Instructions (Signed)
Try to cut back on caffeine, ideally 1-2 cups of coffee daily only  Drink at least 6 glasses of water or non-caffeinated beverages daily  Eat frequent, small meals  Try to get at least 8 hours of sleep

## 2021-09-14 NOTE — ED Provider Notes (Signed)
[facility] Emergency Department Provider Note  ____________________________________________   Event Date/Time   First MD Initiated Contact with Patient 09/14/21 2054     (approximate)  I have reviewed the triage vital signs and the nursing notes.   HISTORY  Chief Complaint Weakness    HPI Scott Hart is a 40 y.o. male here with episode at work.  The patient states that he was at work today.  He states he went to work this morning and was receiving messages, he has been under significant increased stress at work as well as at home recently but this was not necessarily unusual.  He states that he suddenly began to feel very overwhelmed with the amount of messages that he was receiving.  He began to feel very anxious.  He felt like he needed to step away so he stepped away.  He then felt like his heart was racing, he felt some cold sensation in his fingers, and that he was short of breath.  He felt like he was watching himself outside of his body.  He reports that he felt like he could not control his symptoms.  He felt some mild nausea.  No vomiting.  No history of previous reaction similar to this.  He does note he has been drinking a significant mount of caffeine, up to 6 to 8 glasses/day, as well as not necessarily eating very much at work.  No drug or alcohol use.    Past Medical History:  Diagnosis Date   COVID-19 02/29/2020   fatigue x 4 days none since   Phimosis     There are no problems to display for this patient.   Past Surgical History:  Procedure Laterality Date   CIRCUMCISION N/A 04/01/2020   Procedure: CIRCUMCISION ADULT;  Surgeon: Sebastian Ache, MD;  Location: [facility];  Service: Urology;  Laterality: N/A;  PENILE BLOCK   VASECTOMY  2011    Prior to Admission medications   Medication Sig Start Date End Date Taking? Authorizing Provider  senna-docusate (SENOKOT-S) 8.6-50 MG tablet Take 1 tablet by mouth 2 (two)  times daily. While taking pain meds to prevent constipation. 04/01/20   Sebastian Ache, MD    Allergies Patient has no known allergies.  Family History  Problem Relation Age of Onset   Healthy Mother    Healthy Father     Social History Social History   Tobacco Use   Smoking status: Former    Years: 2.00    Types: Cigarettes   Smokeless tobacco: Never   Tobacco comments:    quit 2011 social  Vaping Use   Vaping Use: Never used  Substance Use Topics   Alcohol use: No   Drug use: Never    Review of Systems  Review of Systems  Constitutional:  Negative for chills and fever.  HENT:  Negative for sore throat.   Respiratory:  Positive for shortness of breath.   Cardiovascular:  Positive for palpitations. Negative for chest pain.  Gastrointestinal:  Positive for nausea. Negative for abdominal pain.  Genitourinary:  Negative for flank pain.  Musculoskeletal:  Negative for neck pain.  Skin:  Negative for rash and wound.  Allergic/Immunologic: Negative for immunocompromised state.  Neurological:  Positive for light-headedness. Negative for weakness and numbness.  Hematological:  Does not bruise/bleed easily.  Psychiatric/Behavioral:  The patient is nervous/anxious.     ____________________________________________  PHYSICAL EXAM:      VITAL SIGNS: ED Triage Vitals  Enc Vitals  Group     BP 09/14/21 1624 131/75     Pulse Rate 09/14/21 1624 81     Resp 09/14/21 1624 20     Temp 09/14/21 1624 98.7 F (37.1 C)     Temp Source 09/14/21 1624 Oral     SpO2 09/14/21 1624 99 %     Weight 09/14/21 1624 265 lb (120.2 kg)     Height 09/14/21 1624 6\' 2"  (1.88 m)     Head Circumference --      Peak Flow --      Pain Score 09/14/21 1627 10     Pain Loc --      Pain Edu? --      Excl. in GC? --      Physical Exam Vitals and nursing note reviewed.  Constitutional:      General: He is not in acute distress.    Appearance: He is well-developed.  HENT:     Head:  Normocephalic and atraumatic.  Eyes:     Conjunctiva/sclera: Conjunctivae normal.  Cardiovascular:     Rate and Rhythm: Normal rate and regular rhythm.     Heart sounds: Normal heart sounds.  Pulmonary:     Effort: Pulmonary effort is normal. No respiratory distress.     Breath sounds: No wheezing.  Abdominal:     General: There is no distension.  Musculoskeletal:     Cervical back: Neck supple.  Skin:    General: Skin is warm.     Capillary Refill: Capillary refill takes less than 2 seconds.     Findings: No rash.  Neurological:     Mental Status: He is alert and oriented to person, place, and time.     Motor: No abnormal muscle tone.      ____________________________________________   LABS (all labs ordered are listed, but only abnormal results are displayed)  Labs Reviewed  BASIC METABOLIC PANEL - Abnormal; Notable for the following components:      Result Value   Glucose, Bld 113 (*)    All other components within normal limits  URINALYSIS, ROUTINE W REFLEX MICROSCOPIC - Abnormal; Notable for the following components:   Color, Urine YELLOW (*)    APPearance CLEAR (*)    All other components within normal limits  HEPATIC FUNCTION PANEL - Abnormal; Notable for the following components:   Total Bilirubin 1.5 (*)    Indirect Bilirubin 1.4 (*)    All other components within normal limits  CBC  CK  CBG MONITORING, ED  TROPONIN I (HIGH SENSITIVITY)    ____________________________________________  EKG: Normal sinus rhythm, ventricular 93.  PR 146, QRS 94, QTc 445.  No acute ST elevations or depressions.  No acute events of acute ischemia or infarct. ________________________________________  RADIOLOGY All imaging, including plain films, CT scans, and ultrasounds, independently reviewed by me, and interpretations confirmed via formal radiology reads.  ED MD interpretation:     Official radiology report(s): No results  found.  ____________________________________________  PROCEDURES   Procedure(s) performed (including Critical Care):  Procedures  ____________________________________________  INITIAL IMPRESSION / MDM / ASSESSMENT AND PLAN / ED COURSE  As part of my medical decision making, I reviewed the following data within the electronic MEDICAL RECORD NUMBER Nursing notes reviewed and incorporated, Old chart reviewed, Notes from prior ED visits, and [place] Controlled Substance Database       *Aryn Kops was evaluated in Emergency Department on 09/14/2021 for the symptoms described in the history of present illness. He was  evaluated in the context of the global COVID-19 pandemic, which necessitated consideration that the patient might be at risk for infection with the SARS-CoV-2 virus that causes COVID-19. Institutional protocols and algorithms that pertain to the evaluation of patients at risk for COVID-19 are in a state of rapid change based on information released by regulatory bodies including the CDC and federal and state organizations. These policies and algorithms were followed during the patient's care in the ED.  Some ED evaluations and interventions may be delayed as a result of limited staffing during the pandemic.*     Medical Decision Making: Pleasant 40 year old well-appearing male here with suspected panic attack. Pt has been under significant increased stress recently and sx correlate with being at work. His description is c/w panic attack and he has no objective evidence to suggest arrhythmia, hyperthyroidism, or metabolic abnormality. CBC without anemia or leukocytosis. BMP unremarkable. EKG is nonischemic with no arrhythmias.   I suspect his symptoms are related to a combination of increased stress at home and work, as well as fairly heavy caffeine use and infrequent meals/poor nutrition at work. Had a long discussion with pt and wife re: minimizing caffeine use, improving nutrition, and  stress management. Pt will make these changes and f/u with PCP as outpt.  ____________________________________________  FINAL CLINICAL IMPRESSION(S) / ED DIAGNOSES  Final diagnoses:  Panic attack     MEDICATIONS GIVEN DURING THIS VISIT:  Medications - No data to display   ED Discharge Orders     None        Note:  This document was prepared using Dragon voice recognition software and may include unintentional dictation errors.   Shaune Pollack, MD 09/14/21 [phone]
